# Patient Record
Sex: Female | Born: 1995 | Race: Black or African American | Hispanic: No | Marital: Single | Smoking: Never smoker
Health system: Southern US, Community
[De-identification: ages and names within clinical notes are randomized; demographics above are authoritative.]

## PROBLEM LIST (undated history)

## (undated) HISTORY — PX: NASAL RECONSTRUCTION: SHX2069

## (undated) HISTORY — PX: ABDOMINAL SURGERY: SHX537

## (undated) HISTORY — PX: OTHER SURGICAL HISTORY: SHX169

---

## 2021-05-05 DIAGNOSIS — M25531 Pain in right wrist: Secondary | ICD-10-CM | POA: Diagnosis not present

## 2021-05-05 DIAGNOSIS — S63591A Other specified sprain of right wrist, initial encounter: Secondary | ICD-10-CM | POA: Diagnosis not present

## 2021-05-05 DIAGNOSIS — Z6829 Body mass index (BMI) 29.0-29.9, adult: Secondary | ICD-10-CM | POA: Diagnosis not present

## 2021-05-05 DIAGNOSIS — M79631 Pain in right forearm: Secondary | ICD-10-CM | POA: Diagnosis not present

## 2021-05-15 ENCOUNTER — Ambulatory Visit
Admission: EM | Admit: 2021-05-15 | Discharge: 2021-05-15 | Disposition: A | Discharge status: Home / Self Care | Payer: BC Managed Care – PPO | Attending: Physician Assistant | Admitting: Physician Assistant

## 2021-05-15 ENCOUNTER — Other Ambulatory Visit: Payer: Self-pay

## 2021-05-15 DIAGNOSIS — B349 Viral infection, unspecified: Secondary | ICD-10-CM

## 2021-05-15 DIAGNOSIS — H9201 Otalgia, right ear: Secondary | ICD-10-CM

## 2021-05-15 DIAGNOSIS — R051 Acute cough: Secondary | ICD-10-CM

## 2021-05-15 DIAGNOSIS — R0981 Nasal congestion: Secondary | ICD-10-CM

## 2021-05-15 MED ORDER — PROMETHAZINE-DM 6.25-15 MG/5ML PO SYRP: 5.0000 mL | ORAL_SOLUTION | Freq: Every evening | ORAL | 0 refills | Status: DC | PRN

## 2021-05-15 MED ORDER — IPRATROPIUM BROMIDE 0.06 % NA SOLN: 2.0000 | Freq: Four times a day (QID) | NASAL | 0 refills | Status: DC

## 2021-05-15 NOTE — ED Triage Notes (Signed)
Pt c/o sinus congestion, sore throat, and right ear pain x5-6days. Pt states that her hearing is muffled and that she feels drainage coming down the back of her throat.

## 2021-05-15 NOTE — ED Provider Notes (Signed)
[facility]    CSN: [id number] Arrival date & time: 05/15/21  1817      History   Chief Complaint Chief Complaint  Patient presents with   Otalgia   Nasal Congestion   Sore Throat    HPI Mikayla Dawson is a 26 y.o. female presenting for 5-day history of cough and nasal congestion.  Patient also reports sore throat and right-sided ear pain and pressure.  Reports the ear pain has gotten better over the past couple days since taking Mucinex D and using Flonase.  She denies any associated fevers or fatigue.  No sinus pain, chest pain or breathing trouble.  No vomiting or diarrhea.  No sick contacts or known exposure to COVID or flu.  Patient is otherwise healthy.  No other complaints.  HPI  History reviewed. No pertinent past medical history.  There are no problems to display for this patient.   History reviewed. No pertinent surgical history.  OB History   No obstetric history on file.      Home Medications    Prior to Admission medications   Medication Sig Start Date End Date Taking? Authorizing Provider  ipratropium (ATROVENT) 0.06 % nasal spray Place 2 sprays into both nostrils 4 (four) times daily. 05/15/21  Yes Shirlee LatchEaves, Akshaya Toepfer B, PA-C  promethazine-dextromethorphan (PROMETHAZINE-DM) 6.25-15 MG/5ML syrup Take 5 mLs by mouth at bedtime as needed. 05/15/21  Yes Shirlee LatchEaves, Klint Lezcano B, PA-C    Family History History reviewed. No pertinent family history.  Social History Social History   Tobacco Use   Smoking status: Never   Smokeless tobacco: Never  Vaping Use   Vaping Use: Never used  Substance Use Topics   Alcohol use: Yes   Drug use: Never     Allergies   Patient has no allergy information on record.   Review of Systems Review of Systems  Constitutional:  Negative for chills, diaphoresis, fatigue and fever.  HENT:  Positive for congestion, ear pain, rhinorrhea and sore throat. Negative for sinus pressure and sinus pain.   Respiratory:  Positive for  cough. Negative for shortness of breath.   Gastrointestinal:  Negative for abdominal pain, nausea and vomiting.  Musculoskeletal:  Negative for arthralgias and myalgias.  Skin:  Negative for rash.  Neurological:  Negative for weakness and headaches.  Hematological:  Negative for adenopathy.    Physical Exam Triage Vital Signs ED Triage Vitals  Enc Vitals Group     BP 05/15/21 1831 126/81     Pulse Rate 05/15/21 1831 79     Resp 05/15/21 1831 18     Temp 05/15/21 1831 98.7 F (37.1 C)     Temp Source 05/15/21 1831 Oral     SpO2 05/15/21 1831 98 %     Weight 05/15/21 1828 173 lb (78.5 kg)     Height 05/15/21 1828 5\' 4"  (1.626 m)     Head Circumference --      Peak Flow --      Pain Score 05/15/21 1828 5     Pain Loc --      Pain Edu? --      Excl. in [place]? --    No data found.  Updated Vital Signs BP 126/81 (BP Location: Left Arm)    Pulse 79    Temp 98.7 F (37.1 C) (Oral)    Resp 18    Ht 5\' 4"  (1.626 m)    Wt 173 lb (78.5 kg)    LMP 04/29/2021  SpO2 98%    BMI 29.70 kg/m      Physical Exam Vitals and nursing note reviewed.  Constitutional:      General: She is not in acute distress.    Appearance: Normal appearance. She is well-developed. She is ill-appearing. She is not toxic-appearing.  HENT:     Head: Normocephalic and atraumatic.     Right Ear: A middle ear effusion is present.     Left Ear: A middle ear effusion is present.     Nose: Congestion and rhinorrhea present.     Mouth/Throat:     Mouth: Mucous membranes are moist.     Pharynx: Oropharynx is clear.  Eyes:     General: No scleral icterus.       Right eye: No discharge.        Left eye: No discharge.     Conjunctiva/sclera: Conjunctivae normal.  Cardiovascular:     Rate and Rhythm: Normal rate and regular rhythm.     Heart sounds: Normal heart sounds.  Pulmonary:     Effort: Pulmonary effort is normal. No respiratory distress.     Breath sounds: Normal breath sounds.  Musculoskeletal:      Cervical back: Neck supple.  Skin:    General: Skin is dry.  Neurological:     General: No focal deficit present.     Mental Status: She is alert. Mental status is at baseline.     Motor: No weakness.     Coordination: Coordination normal.     Gait: Gait normal.  Psychiatric:        Mood and Affect: Mood normal.        Behavior: Behavior normal.        Thought Content: Thought content normal.     UC Treatments / Results  Labs (all labs ordered are listed, but only abnormal results are displayed) Labs Reviewed - No data to display  EKG   Radiology No results found.  Procedures Procedures (including critical care time)  Medications Ordered in UC Medications - No data to display  Initial Impression / Assessment and Plan / UC Course  I have reviewed the triage vital signs and the nursing notes.  Pertinent labs & imaging results that were available during my care of the patient were reviewed by me and considered in my medical decision making (see chart for details).  26 year old female presenting for 5-day history of cough and congestion.  Also reports right-sided ear pain and pressure and sore throat.  Vitals are normal and stable patient is overall well-appearing.  On exam she is mildly ill-appearing but nontoxic, has effusions of bilateral TMs without erythema or bulging, nasal congestion with light yellowish drainage.  Chest clear to auscultation heart regular rate and rhythm.  Discussed testing for COVID-19.  She has already been ill for 5 days.  Advised her just to wear a mask for the next 5 days.  Advised her symptoms consistent with viral illness.  Supportive care encouraged with increasing rest and fluids.  Advised to continue Mucinex during the day and use a Promethazine DM at nighttime.  Also sent Atrovent nasal spray.  Assured patient she should be feeling better in the next 5 to 7 days but if she feels worse she should be seen again.   Final Clinical  Impressions(s) / UC Diagnoses   Final diagnoses:  Viral illness  Right ear pain  Nasal congestion  Acute cough     Discharge Instructions  URI/COLD SYMPTOMS: Your exam today is consistent with a viral illness. Antibiotics are not indicated at this time. Use medications as directed, including cough syrup, nasal saline, and decongestants. Mucinex D during the day and prescription cough meds at night. Your symptoms should improve over the next few days and resolve within  the next 7-10 days. Increase rest and fluids. F/u if symptoms worsen or predominate such as sore throat, ear pain, productive cough, shortness of breath, or if you develop high fevers or worsening fatigue over the next several days.       ED Prescriptions     Medication Sig Dispense Auth. Provider   ipratropium (ATROVENT) 0.06 % nasal spray Place 2 sprays into both nostrils 4 (four) times daily. 15 mL Eusebio Friendly B, PA-C   promethazine-dextromethorphan (PROMETHAZINE-DM) 6.25-15 MG/5ML syrup Take 5 mLs by mouth at bedtime as needed. 118 mL Shirlee Latch, PA-C      PDMP not reviewed this encounter.   Shirlee Latch, PA-C 05/15/21 1905

## 2021-05-15 NOTE — Discharge Instructions (Signed)
URI/COLD SYMPTOMS: Your exam today is consistent with a viral illness. Antibiotics are not indicated at this time. Use medications as directed, including cough syrup, nasal saline, and decongestants. Mucinex D during the day and prescription cough meds at night. Your symptoms should improve over the next few days and resolve within  the next 7-10 days. Increase rest and fluids. F/u if symptoms worsen or predominate such as sore throat, ear pain, productive cough, shortness of breath, or if you develop high fevers or worsening fatigue over the next several days.

## 2021-05-29 ENCOUNTER — Telehealth: Payer: Self-pay | Admitting: Physician Assistant

## 2021-05-29 NOTE — Telephone Encounter (Signed)
Received a call from [facility] at [facility] stating that a referral was placed for Mikayla Dawson to be seen and evaluated from our office. Mikayla Dawson asks if this is correct as they have reviewed the chart and do not see a reason for the referral as Mikayla Dawson was evaluated for ear pain and asks if the referral should be transferred to Ear, Nose, and Throat.   Pt was last seen on 05/15/21 for a Viral Illness. Spoke with Mikayla Jacks, PA, and read through the encounter for Mikayla Dawson on 05/15/21. We did not see a note made for a referral to [facility] or Ear, Nose, and Throat. The referrals tab inside [facility] for Mikayla Dawson did not show an order placed for [facility] or Ear, Nose, and Throat. Mikayla Dawson was informed and she states that she will cancel the order as one was not sent from our office.

## 2021-08-31 ENCOUNTER — Ambulatory Visit (INDEPENDENT_AMBULATORY_CARE_PROVIDER_SITE_OTHER): Payer: Medicaid Other

## 2021-08-31 ENCOUNTER — Other Ambulatory Visit: Payer: Self-pay

## 2021-08-31 ENCOUNTER — Ambulatory Visit
Admission: EM | Admit: 2021-08-31 | Discharge: 2021-08-31 | Disposition: A | Discharge status: Home / Self Care | Payer: Medicaid Other

## 2021-08-31 ENCOUNTER — Encounter: Payer: Self-pay | Admitting: Emergency Medicine

## 2021-08-31 DIAGNOSIS — M25571 Pain in right ankle and joints of right foot: Secondary | ICD-10-CM

## 2021-08-31 DIAGNOSIS — S93601A Unspecified sprain of right foot, initial encounter: Secondary | ICD-10-CM | POA: Diagnosis not present

## 2021-08-31 DIAGNOSIS — S99921A Unspecified injury of right foot, initial encounter: Secondary | ICD-10-CM | POA: Diagnosis not present

## 2021-08-31 DIAGNOSIS — S99911A Unspecified injury of right ankle, initial encounter: Secondary | ICD-10-CM | POA: Diagnosis not present

## 2021-08-31 MED ORDER — IBUPROFEN 800 MG PO TABS: 800.0000 mg | ORAL_TABLET | Freq: Three times a day (TID) | ORAL | 0 refills | Status: AC

## 2021-08-31 NOTE — Discharge Instructions (Addendum)
Follow with orthopedics this week ?Bare foot as tolerated.  ?Ice area of pain for 48 hours from the time of injury for 20 minutes at a time.  ?

## 2021-08-31 NOTE — ED Triage Notes (Signed)
Pt c/o right ankle pain, swelling and bruising. Started a yesterday. She states she turned her ankle while she was running and felt nauseous at the time. Unable to bare weight.  ?

## 2021-08-31 NOTE — ED Provider Notes (Signed)
?[facility] ? ? ? ?CSN: [id number] ?Arrival date & time: 08/31/21  [id number] ? ? ?  ? ?History   ?Chief Complaint ?Chief Complaint  ?Patient presents with  ? Ankle Pain  ?  right  ? ? ?HPI ?Mikayla Dawson is a 26 y.o. female who presents with R ankle pain, swelling and bruising which occurred as she twisted it when she was running yesterday. She is unable to bare weight due to the pain. She also has pain on the bottom of her R foot when she walks Has fractured this ankle in the past as child.  ? ? ? ?History reviewed. No pertinent past medical history. ? ?There are no problems to display for this patient. ? ? ?Past Surgical History:  ?Procedure Laterality Date  ? ABDOMINAL SURGERY    ? NASAL RECONSTRUCTION    ? NPFO    ? ? ?OB History   ?No obstetric history on file. ?  ? ? ? ?Home Medications   ? ?Prior to Admission medications   ?Medication Sig Start Date End Date Taking? Authorizing Provider  ?fluticasone (FLONASE) 50 MCG/ACT nasal spray SPRAY 1 SPRAY INTO EACH NOSTRIL EVERY DAY 04/22/20  Yes [provider]  ?levocetirizine (XYZAL) 5 MG tablet Take 5 mg by mouth every evening.   Yes [provider]  ?ipratropium (ATROVENT) 0.06 % nasal spray Place 2 sprays into both nostrils 4 (four) times daily. 05/15/21   Danton Clap, PA-C  ?promethazine-dextromethorphan (PROMETHAZINE-DM) 6.25-15 MG/5ML syrup Take 5 mLs by mouth at bedtime as needed. 05/15/21   Danton Clap, PA-C  ? ? ?Family History ?No family history on file. ? ?Social History ?Social History  ? ?Tobacco Use  ? Smoking status: Never  ? Smokeless tobacco: Never  ?Vaping Use  ? Vaping Use: Never used  ?Substance Use Topics  ? Alcohol use: Yes  ? Drug use: Never  ? ? ? ?Allergies   ?Patient has no known allergies. ? ? ?Review of Systems ?Review of Systems  ?Musculoskeletal:  Positive for arthralgias, gait problem and joint swelling.  ?Skin:  Negative for color change, pallor, rash and wound.  ?Neurological:  Negative for weakness and  numbness.  ? ? ?Physical Exam ?Triage Vital Signs ?ED Triage Vitals  ?Enc Vitals Group  ?   BP 08/31/21 0842 117/76  ?   Pulse Rate 08/31/21 0842 73  ?   Resp 08/31/21 0842 16  ?   Temp 08/31/21 0842 98.3 ?F (36.8 ?C)  ?   Temp Source 08/31/21 0842 Oral  ?   SpO2 08/31/21 0842 100 %  ?   Weight 08/31/21 0839 173 lb 1 oz (78.5 kg)  ?   Height 08/31/21 0839 5\' 4"  (1.626 m)  ?   Head Circumference --   ?   Peak Flow --   ?   Pain Score 08/31/21 0838 8  ?   Pain Loc --   ?   Pain Edu? --   ?   Excl. in [place]? --   ? ?No data found. ? ?Updated Vital Signs ?BP 117/76 (BP Location: Right Arm)   Pulse 73   Temp 98.3 ?F (36.8 ?C) (Oral)   Resp 16   Ht 5\' 4"  (1.626 m)   Wt 173 lb 1 oz (78.5 kg)   LMP 08/24/2021 (Approximate)   SpO2 100%   BMI 29.71 kg/m?  ? ?Visual Acuity ?Right Eye Distance:   ?Left Eye Distance:   ?Bilateral Distance:   ? ?Right  Eye Near:   ?Left Eye Near:    ?Bilateral Near:    ? ?Physical Exam ?Vitals and nursing note reviewed.  ?Constitutional:   ?   General: She is not in acute distress. ?   Appearance: She is not toxic-appearing.  ?   Comments: Sitting on a wheelchair  ?Eyes:  ?   General: No scleral icterus. ?   Conjunctiva/sclera: Conjunctivae normal.  ?Cardiovascular:  ?   Pulses: Normal pulses.  ?Pulmonary:  ?   Effort: Pulmonary effort is normal.  ?Musculoskeletal:     ?   General: Normal range of motion.  ?   Cervical back: Neck supple.  ?   Comments: R ANKLE- has mild swelling on lateral ankle region, with no ecchymosis noted ?                Thompson test is negative.  ? ?R FOOT- unable to provoke pain with palpation of the sole of her foot.   ?Skin: ?   General: Skin is warm and dry.  ?   Capillary Refill: Capillary refill takes less than 2 seconds.  ?   Findings: No bruising, erythema or rash.  ?Neurological:  ?   Mental Status: She is alert and oriented to person, place, and time.  ?   Sensory: No sensory deficit.  ?   Motor: No weakness.  ?   Gait: Gait abnormal.  ?Psychiatric:     ?    Mood and Affect: Mood normal.     ?   Behavior: Behavior normal.     ?   Thought Content: Thought content normal.     ?   Judgment: Judgment normal.  ? ? ? ?UC Treatments / Results  ?Labs ?(all labs ordered are listed, but only abnormal results are displayed) ?Labs Reviewed - No data to display ? ?EKG ? ? ?Radiology ?No results found. ? ?Procedures ?Procedures (including critical care time) ? ?Medications Ordered in UC ?Medications - No data to display ? ?Initial Impression / Assessment and Plan / UC Course  ?I have reviewed the triage vital signs and the nursing notes. ? ?Pertinent  imaging results that were available during my care of the patient were reviewed by me and considered in my medical decision making (see chart for details). ? ?R ankle sprain ?R foot strain ? ?She was placed with ace bandage and on crutches. See instructions. I prescribed her Ibuprofen as noted.  ? ? ?Final Clinical Impressions(s) / UC Diagnoses  ? ?Final diagnoses:  ?None  ? ?Discharge Instructions   ?None ?  ? ?ED Prescriptions   ?None ?  ? ?PDMP not reviewed this encounter. ?  Shelby Mattocks, PA-C ?08/31/21 [id number] ? ?

## 2021-09-07 DIAGNOSIS — S93691A Other sprain of right foot, initial encounter: Secondary | ICD-10-CM | POA: Diagnosis not present

## 2021-09-07 DIAGNOSIS — M25571 Pain in right ankle and joints of right foot: Secondary | ICD-10-CM | POA: Diagnosis not present

## 2021-09-07 DIAGNOSIS — M7989 Other specified soft tissue disorders: Secondary | ICD-10-CM | POA: Diagnosis not present

## 2021-09-07 DIAGNOSIS — S93401A Sprain of unspecified ligament of right ankle, initial encounter: Secondary | ICD-10-CM | POA: Diagnosis not present

## 2023-01-27 IMAGING — CR DG ANKLE COMPLETE 3+V*R*
3 series · 3 of 3 positions shown · non-contrast
Comparison: None.

CLINICAL DATA: Ankle injury

EXAM:
RIGHT ANKLE - COMPLETE 3+ VIEW

[ankle ap]
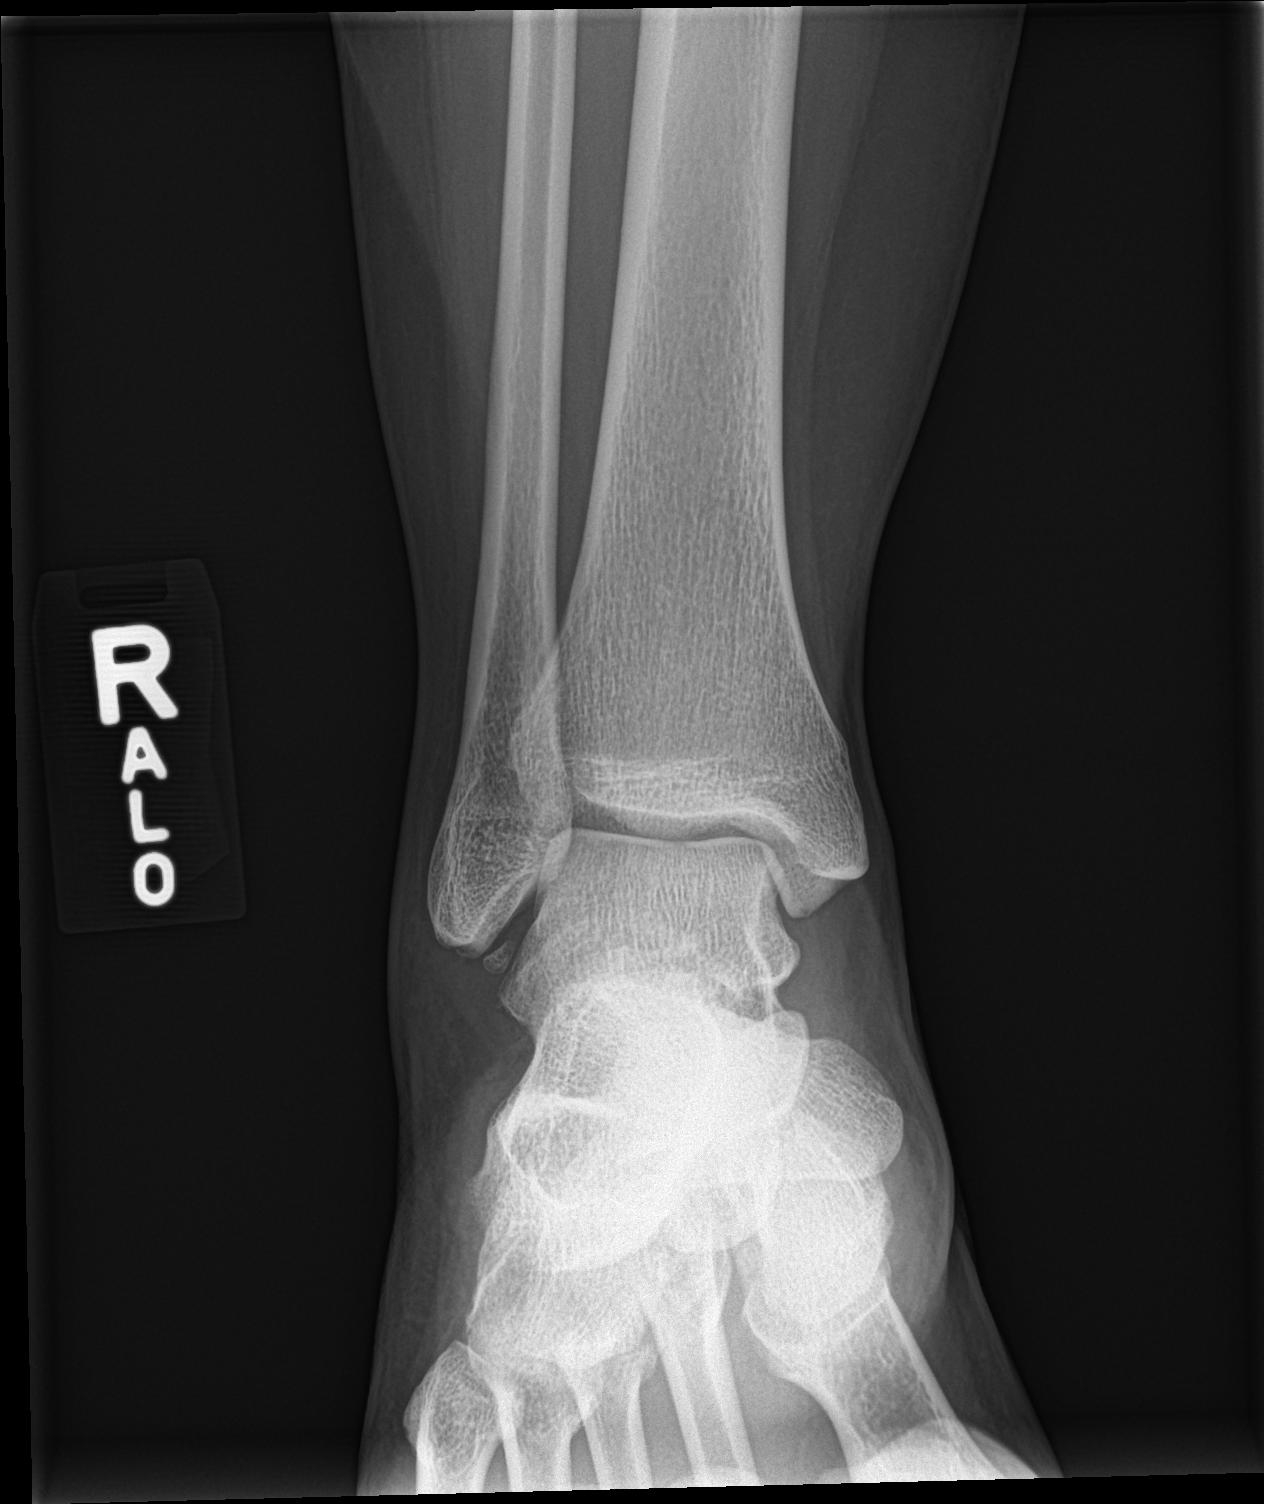

[ankle obl]
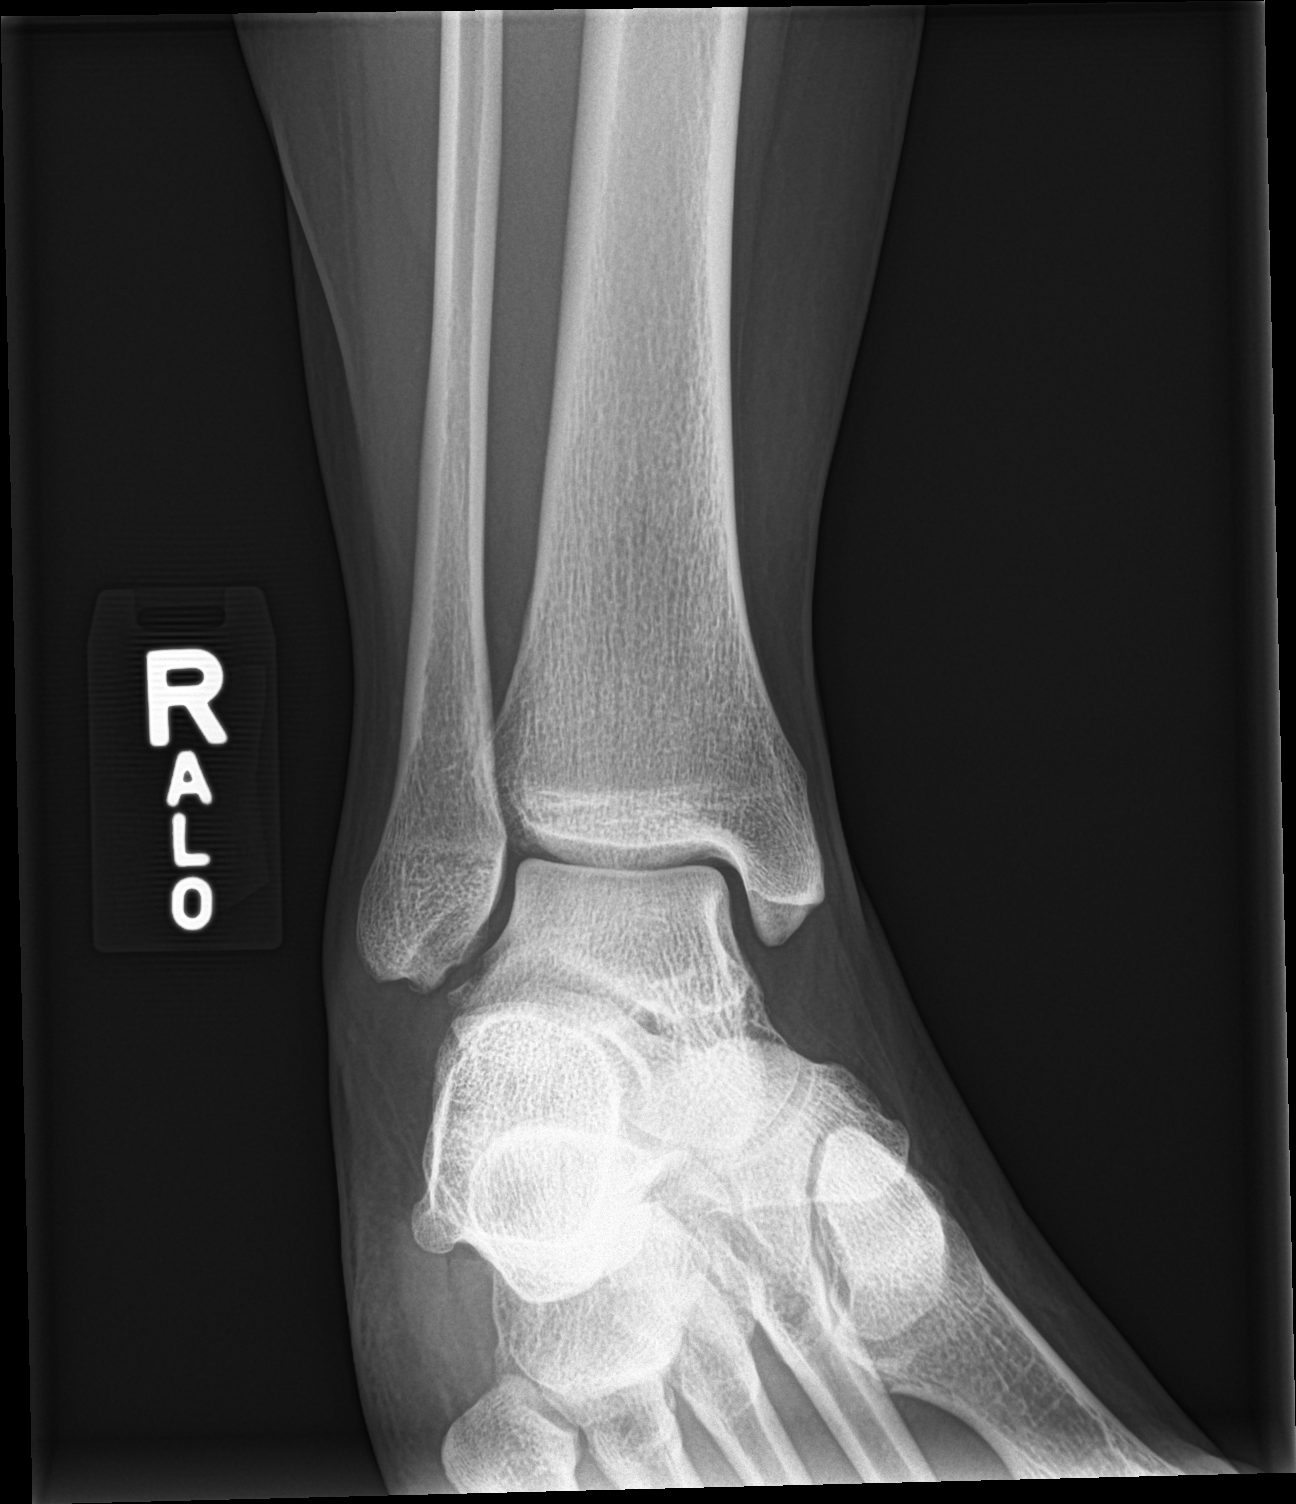

[ankle lat]
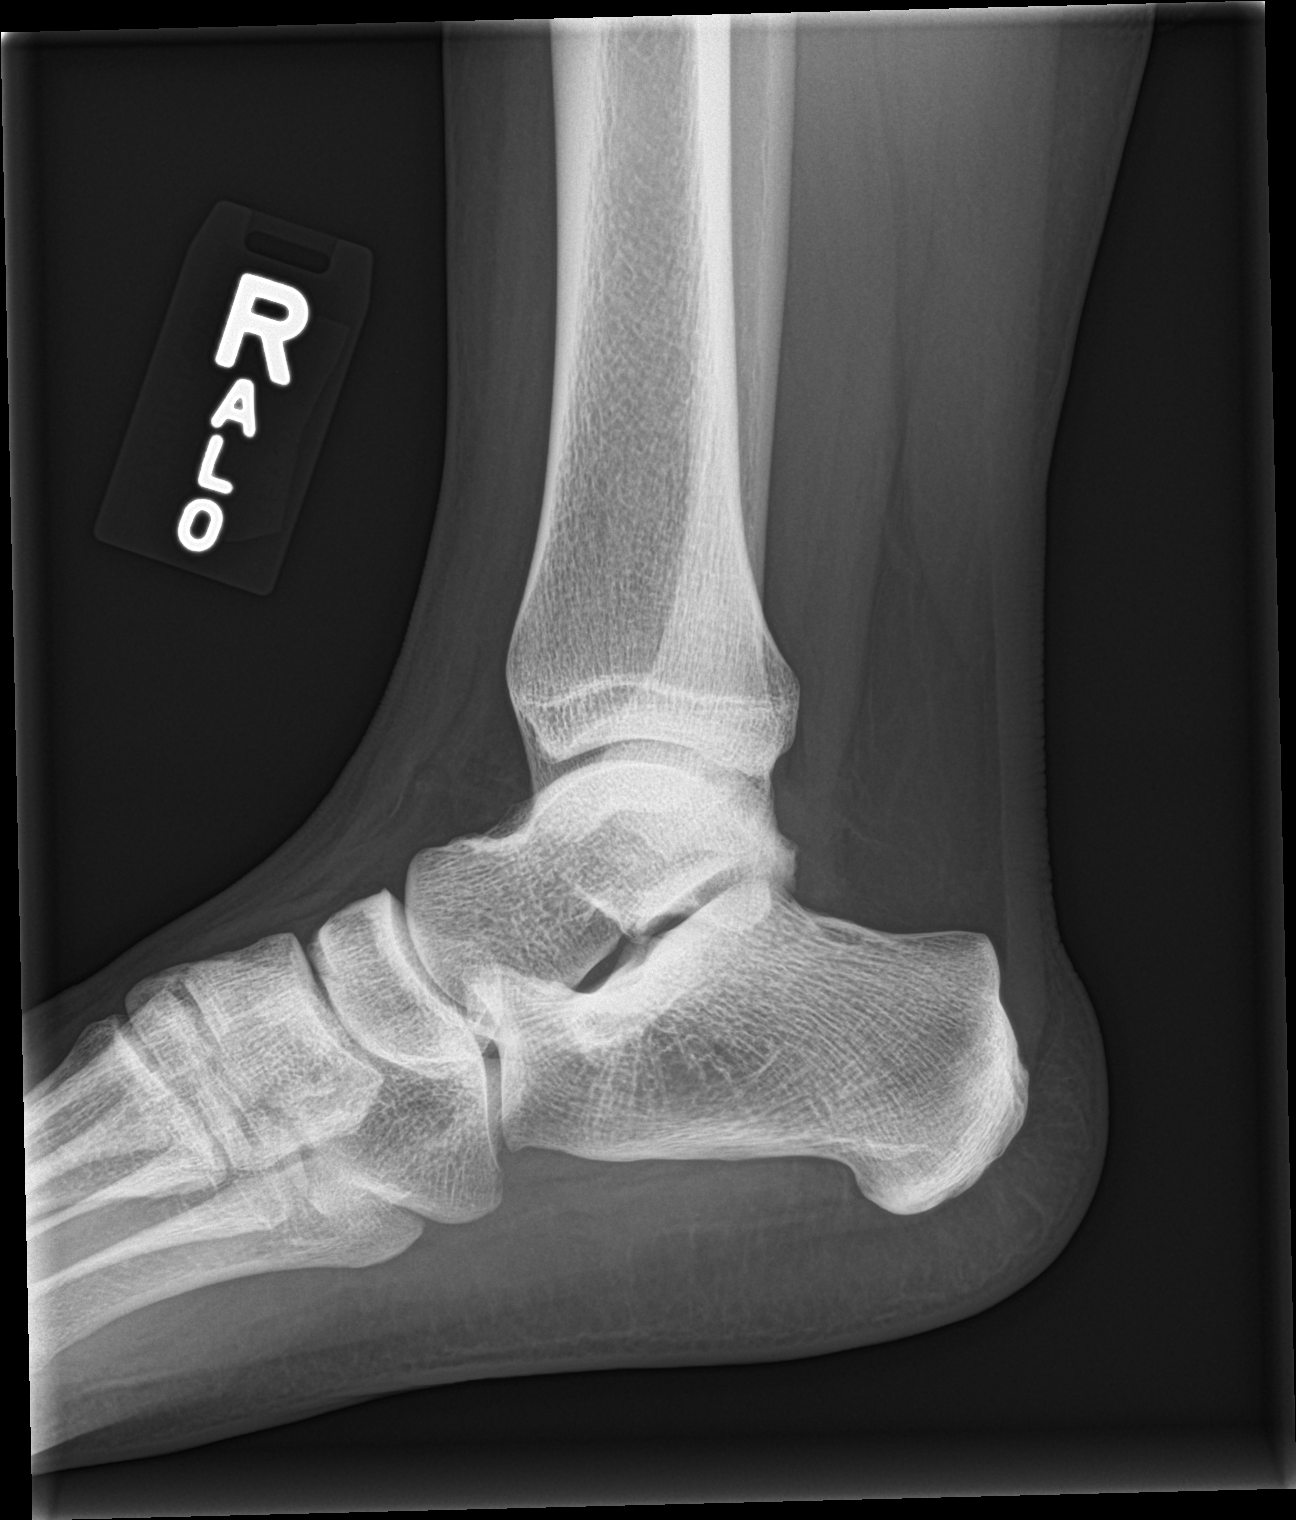

[3 of 3 positions shown; findings below may reference images not displayed]

FINDINGS: There is no evidence of fracture, dislocation, or joint effusion.
Chronic ossification adjacent to the inferolateral malleolus. Soft
tissues are unremarkable.
IMPRESSION: No acute osseous abnormality identified.

## 2023-01-27 IMAGING — CR DG FOOT COMPLETE 3+V*R*
3 series · 3 of 3 positions shown · non-contrast
Comparison: None.

CLINICAL DATA: Foot injury

EXAM:
RIGHT FOOT COMPLETE - 3+ VIEW

[foot ap]
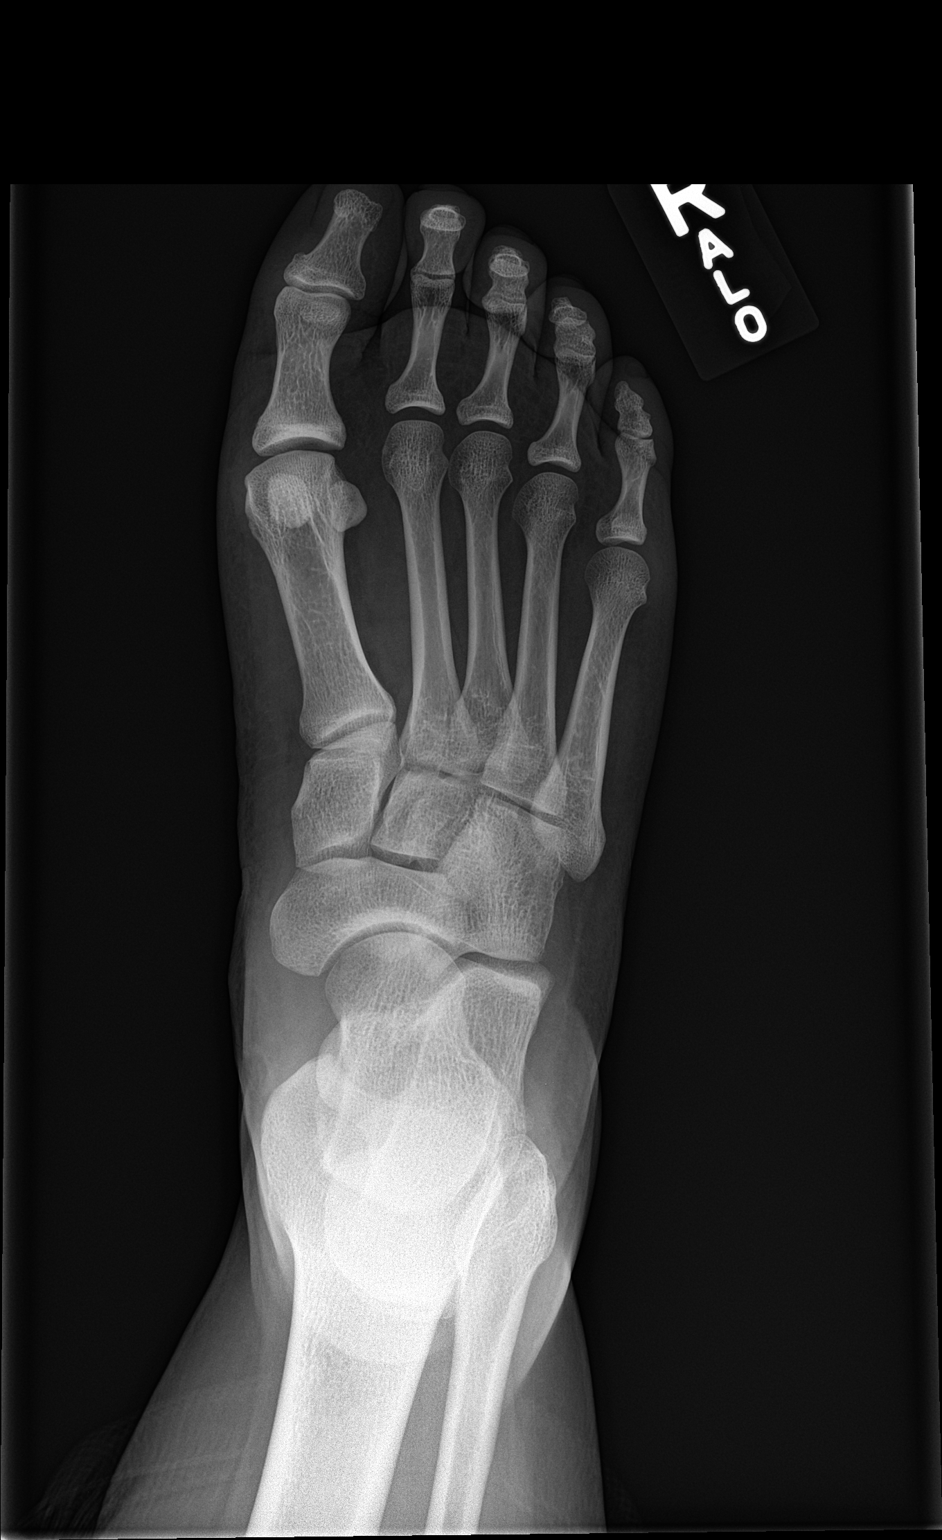

[foot obl]
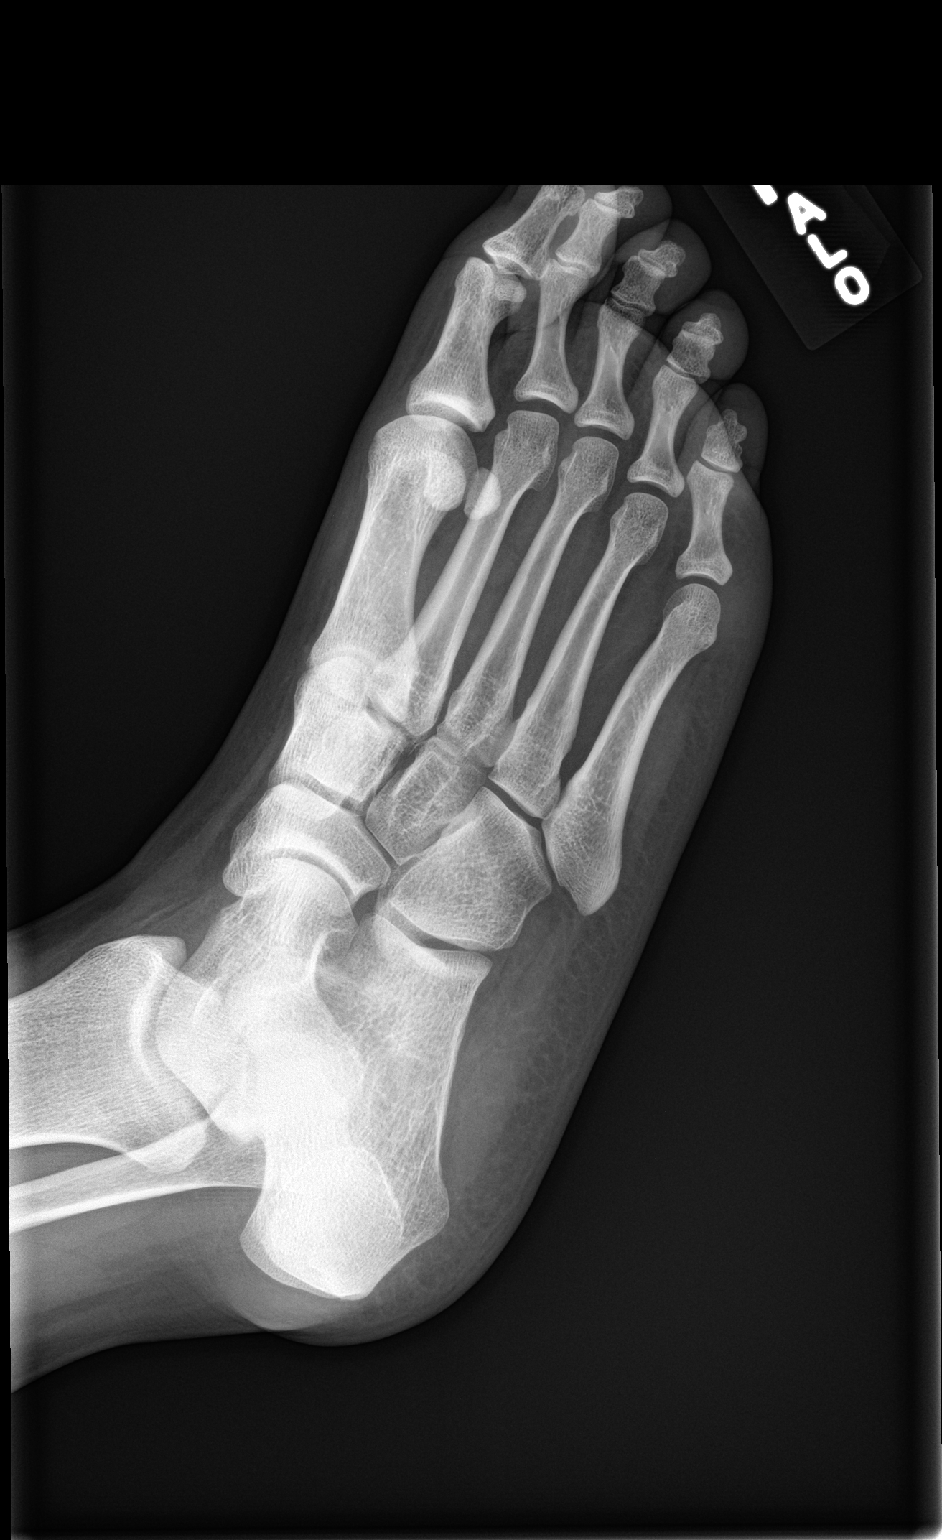

[foot lat]
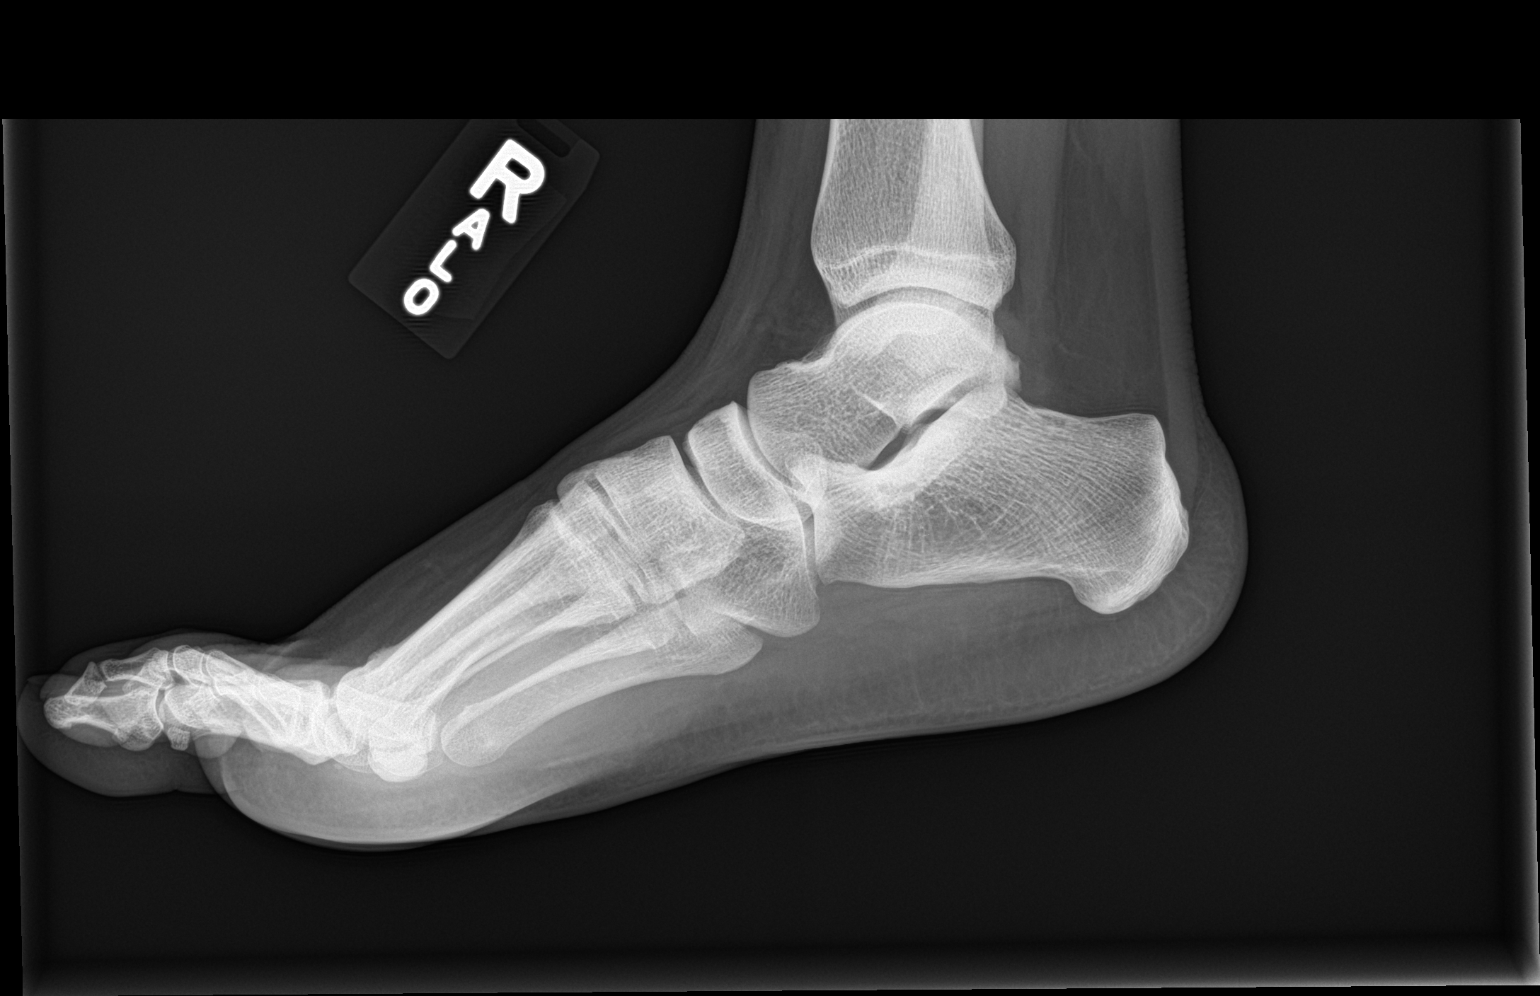

[3 of 3 positions shown; findings below may reference images not displayed]

FINDINGS: There is no evidence of fracture or dislocation. There is no
evidence of arthropathy or other focal bone abnormality. Soft
tissues are unremarkable.
IMPRESSION: No acute osseous abnormality identified.
